# Patient Record
Sex: Male | Born: 1975 | Race: Black or African American | Hispanic: No | Marital: Single | Smoking: Current every day smoker
Health system: Southern US, Community
[De-identification: ages and names within clinical notes are randomized; demographics above are authoritative.]

## PROBLEM LIST (undated history)

## (undated) HISTORY — PX: HIP SURGERY: SHX245

---

## 2015-08-13 ENCOUNTER — Emergency Department (HOSPITAL_COMMUNITY)
Admission: EM | Admit: 2015-08-13 | Discharge: 2015-08-13 | Disposition: A | Discharge status: Home / Self Care | Payer: Self-pay | Attending: Emergency Medicine | Admitting: Emergency Medicine

## 2015-08-13 ENCOUNTER — Encounter (HOSPITAL_COMMUNITY): Payer: Self-pay

## 2015-08-13 DIAGNOSIS — R21 Rash and other nonspecific skin eruption: Secondary | ICD-10-CM | POA: Insufficient documentation

## 2015-08-13 DIAGNOSIS — F17200 Nicotine dependence, unspecified, uncomplicated: Secondary | ICD-10-CM | POA: Insufficient documentation

## 2015-08-13 DIAGNOSIS — J029 Acute pharyngitis, unspecified: Secondary | ICD-10-CM | POA: Insufficient documentation

## 2015-08-13 DIAGNOSIS — K029 Dental caries, unspecified: Secondary | ICD-10-CM | POA: Insufficient documentation

## 2015-08-13 DIAGNOSIS — I10 Essential (primary) hypertension: Secondary | ICD-10-CM | POA: Insufficient documentation

## 2015-08-13 LAB — RAPID STREP SCREEN (MED CTR MEBANE ONLY): Streptococcus, Group A Screen (Direct): NEGATIVE

## 2015-08-13 MED ORDER — PERMETHRIN 5 % EX CREA: TOPICAL_CREAM | CUTANEOUS | Status: AC

## 2015-08-13 MED ORDER — PENICILLIN V POTASSIUM 500 MG PO TABS: 500.0000 mg | ORAL_TABLET | Freq: Four times a day (QID) | ORAL | Status: AC

## 2015-08-13 NOTE — ED Provider Notes (Signed)
CSN: [id number]     Arrival date & time 08/13/15  0719 History   First MD Initiated Contact with Patient 08/13/15 [phone]     Chief Complaint  Patient presents with  . Rash     (Consider location/radiation/quality/duration/timing/severity/associated sxs/prior Treatment) HPI Comments: Here with girlfriend who has rash and concern for bed bugs/scabies.  He does not have a rash himself but is concerned about possible exposure.  He also c/o throat pain x 2 months, treated for ear infection with zithromax which he completed.  C/o pain with swallowing.  No fever, SOB, CP.  Chews tobacco and smokes and concerned because his "gums are receeding".  Does not have a dentist or doctor.    Patient is a 40 y.o. male presenting with rash. The history is provided by the patient.  Rash Associated symptoms: sore throat   Associated symptoms: no abdominal pain, no fever, no headaches, no nausea, no shortness of breath and not vomiting     History reviewed. No pertinent past medical history. Past Surgical History  Procedure Laterality Date  . Hip surgery     No family history on file. Social History  Substance Use Topics  . Smoking status: Current Every Day Smoker  . Smokeless tobacco: None  . Alcohol Use: No    Review of Systems  Constitutional: Negative for fever, activity change and appetite change.  HENT: Positive for dental problem and sore throat. Negative for congestion and drooling.   Eyes: Negative for visual disturbance.  Respiratory: Negative for cough, chest tightness and shortness of breath.   Cardiovascular: Negative for chest pain.  Gastrointestinal: Negative for nausea, vomiting and abdominal pain.  Genitourinary: Negative for dysuria and hematuria.  Skin: Positive for rash.  Neurological: Negative for dizziness, weakness, light-headedness and headaches.  A complete 10 system review of systems was obtained and all systems are negative except as noted in the HPI and PMH.       Allergies  Review of patient's allergies indicates no known allergies.  Home Medications   Prior to Admission medications   Medication Sig Start Date End Date Taking? Authorizing Provider  penicillin v potassium (VEETID) 500 MG tablet Take 1 tablet (500 mg total) by mouth 4 (four) times daily. 08/13/15 08/20/15  Glynn Octave, MD  permethrin (ELIMITE) 5 % cream Apply to affected area once 08/13/15   Glynn Octave, MD   BP 157/105 mmHg  Pulse 89  Temp(Src) 97.9 F (36.6 C) (Oral)  Resp 18  Ht  (1.854 m)  Wt 179 lb (81.194 kg)  BMI 23.62 kg/m2  SpO2 99% Physical Exam  Constitutional: He is oriented to person, place, and time. He appears well-developed and well-nourished. No distress.  HENT:  Head: Normocephalic and atraumatic.  Mouth/Throat: Oropharynx is clear and moist. No oropharyngeal exudate.  Multiple missing teeth, Large carie to R lower molar.  Floor of mouth soft, no abscess. Tonsils symmetric, uvula midline. 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  Eyes: Conjunctivae and EOM are normal. Pupils are equal, round, and reactive to light.  Neck: Normal range of motion. Neck supple.  No meningismus.  Cardiovascular: Normal rate, regular rhythm, normal heart sounds and intact distal pulses.   No murmur heard. Pulmonary/Chest: Effort normal and breath sounds normal. No respiratory distress.  Abdominal: Soft. There is no tenderness. There is no rebound and no guarding.  Musculoskeletal: Normal range of motion. He exhibits no edema or tenderness.  Neurological: He is alert and oriented  to person, place, and time. No cranial nerve deficit. He exhibits normal muscle tone. Coordination normal.  No ataxia on finger to nose bilaterally. No pronator drift. 5/5 strength throughout. CN 2-12 intact.Equal grip strength. Sensation intact.   Skin: Skin is warm. No rash noted.  Psychiatric: He has a normal mood and affect. His behavior is normal.   Nursing note and vitals reviewed.   ED Course  Procedures (including critical care time) Labs Review Labs Reviewed  RAPID STREP SCREEN (NOT [facility])    Imaging Review No results found. I have personally reviewed and evaluated these images and lab results as part of my medical decision-making.   EKG Interpretation None      MDM   Final diagnoses:  Rash and nonspecific skin eruption  Dental caries  Essential hypertension   Here with girlfriend who has rash concerning for bed bugs/scabies.  No rash himself.  No CP or SOB.   Also c/o throat and gum pain.  Hx smoker and tobacco abuse.  Several missing teeth and caries.  No abscess or Ludwig's angina.  Discussed with patient he needs to stop smoking and stop chewing tobacco. He is at risk for developing oral cancer. We'll treat dental caries. Also treat empirically for scabies/bedbugs exposure with permethrin cream.    Glynn OctaveStephen Leanor Voris, MD 08/13/15 [phone]

## 2015-08-13 NOTE — Discharge Instructions (Signed)
Dental Pain Follow up with your doctor regarding your elevated blood pressure. Return to the ED if you develop new or worsening symptoms. Dental pain may be caused by many things, including:  Tooth decay (cavities or caries). Cavities expose the nerve of your tooth to air and hot or cold temperatures. This can cause pain or discomfort.  Abscess or infection. A dental abscess is a collection of infected pus from a bacterial infection in the inner part of the tooth (pulp). It usually occurs at the end of the tooth's root.  Injury.  An unknown reason (idiopathic). Your pain may be mild or severe. It may only occur when:  You are chewing.  You are exposed to hot or cold temperature.  You are eating or drinking sugary foods or beverages, such as soda or candy. Your pain may also be constant. HOME CARE INSTRUCTIONS Watch your dental pain for any changes. The following actions may help to lessen any discomfort that you are feeling:  Take medicines only as directed by your dentist.  If you were prescribed an antibiotic medicine, finish all of it even if you start to feel better.  Keep all follow-up visits as directed by your dentist. This is important.  Do not apply heat to the outside of your face.  Rinse your mouth or gargle with salt water if directed by your dentist. This helps with pain and swelling.  You can make salt water by adding  tsp of salt to 1 cup of warm water.  Apply ice to the painful area of your face:  Put ice in a plastic bag.  Place a towel between your skin and the bag.  Leave the ice on for 20 minutes, 2-3 times per day.  Avoid foods or drinks that cause you pain, such as:  Very hot or very cold foods or drinks.  Sweet or sugary foods or drinks. SEEK MEDICAL CARE IF:  Your pain is not controlled with medicines.  Your symptoms are worse.  You have new symptoms. SEEK IMMEDIATE MEDICAL CARE IF:  You are unable to open your mouth.  You are having  trouble breathing or swallowing.  You have a fever.  Your face, neck, or jaw is swollen.   This information is not intended to replace advice given to you by your health care provider. Make sure you discuss any questions you have with your health care provider.   Document Released: 02/21/2005 Document Revised: 07/08/2014 Document Reviewed: 02/17/2014 Elsevier Interactive Patient Education [employer]2016 [employer]

## 2015-08-13 NOTE — ED Notes (Signed)
Pt reports has had an itchy rash for the past few days.  Pt also reports 2 months ago had an ear and throat infection and says since then he has had pain in throat and shoulder.  Pt also says he chews tobacco and is concerned because his gums are receeding.

## 2015-08-15 LAB — CULTURE, GROUP A STREP (THRC)

## 2020-02-26 ENCOUNTER — Emergency Department (HOSPITAL_COMMUNITY)
Admission: EM | Admit: 2020-02-26 | Discharge: 2020-02-26 | Disposition: A | Discharge status: Home / Self Care | Payer: BLUE CROSS/BLUE SHIELD | Attending: Emergency Medicine | Admitting: Emergency Medicine

## 2020-02-26 ENCOUNTER — Encounter (HOSPITAL_COMMUNITY): Payer: Self-pay | Admitting: Emergency Medicine

## 2020-02-26 ENCOUNTER — Other Ambulatory Visit: Payer: Self-pay

## 2020-02-26 ENCOUNTER — Emergency Department (HOSPITAL_COMMUNITY): Payer: BLUE CROSS/BLUE SHIELD

## 2020-02-26 DIAGNOSIS — K625 Hemorrhage of anus and rectum: Secondary | ICD-10-CM | POA: Diagnosis present

## 2020-02-26 DIAGNOSIS — F1590 Other stimulant use, unspecified, uncomplicated: Secondary | ICD-10-CM | POA: Insufficient documentation

## 2020-02-26 DIAGNOSIS — K529 Noninfective gastroenteritis and colitis, unspecified: Secondary | ICD-10-CM | POA: Diagnosis not present

## 2020-02-26 DIAGNOSIS — F17210 Nicotine dependence, cigarettes, uncomplicated: Secondary | ICD-10-CM | POA: Diagnosis not present

## 2020-02-26 LAB — CBC WITH DIFFERENTIAL/PLATELET
Abs Immature Granulocytes: 0.04 10*3/uL (ref 0.00–0.07)
Basophils Absolute: 0 10*3/uL (ref 0.0–0.1)
Basophils Relative: 1 %
Eosinophils Absolute: 0.1 10*3/uL (ref 0.0–0.5)
Eosinophils Relative: 1 %
HCT: 39.5 % (ref 39.0–52.0)
Hemoglobin: 13 g/dL (ref 13.0–17.0)
Immature Granulocytes: 1 %
Lymphocytes Relative: 24 %
Lymphs Abs: 2 10*3/uL (ref 0.7–4.0)
MCH: 32.8 pg (ref 26.0–34.0)
MCHC: 32.9 g/dL (ref 30.0–36.0)
MCV: 99.7 fL (ref 80.0–100.0)
Monocytes Absolute: 0.8 10*3/uL (ref 0.1–1.0)
Monocytes Relative: 10 %
Neutro Abs: 5.3 10*3/uL (ref 1.7–7.7)
Neutrophils Relative %: 63 %
Platelets: 272 10*3/uL (ref 150–400)
RBC: 3.96 MIL/uL — ABNORMAL LOW (ref 4.22–5.81)
RDW: 13 % (ref 11.5–15.5)
WBC: 8.3 10*3/uL (ref 4.0–10.5)
nRBC: 0 % (ref 0.0–0.2)

## 2020-02-26 LAB — COMPREHENSIVE METABOLIC PANEL
ALT: 16 U/L (ref 0–44)
AST: 17 U/L (ref 15–41)
Albumin: 3.6 g/dL (ref 3.5–5.0)
Alkaline Phosphatase: 52 U/L (ref 38–126)
Anion gap: 7 (ref 5–15)
BUN: 8 mg/dL (ref 6–20)
CO2: 24 mmol/L (ref 22–32)
Calcium: 8.3 mg/dL — ABNORMAL LOW (ref 8.9–10.3)
Chloride: 105 mmol/L (ref 98–111)
Creatinine, Ser: 1 mg/dL (ref 0.61–1.24)
GFR, Estimated: >60 mL/min (ref >60)
Glucose, Bld: 114 mg/dL — ABNORMAL HIGH (ref 70–99)
Potassium: 3.7 mmol/L (ref 3.5–5.1)
Sodium: 136 mmol/L (ref 135–145)
Total Bilirubin: 0.3 mg/dL (ref 0.3–1.2)
Total Protein: 6.6 g/dL (ref 6.5–8.1)

## 2020-02-26 LAB — POC OCCULT BLOOD, ED: Fecal Occult Bld: NEGATIVE

## 2020-02-26 MED ORDER — CIPROFLOXACIN HCL 500 MG PO TABS: ORAL_TABLET | ORAL | 0 refills | Status: AC

## 2020-02-26 MED ORDER — IOHEXOL 300 MG/ML  SOLN
100.0000 mL | Freq: Once | INTRAMUSCULAR | Status: AC | PRN
  Administered 2020-02-26: 100 mL via INTRAVENOUS

## 2020-02-26 MED ORDER — METRONIDAZOLE 500 MG PO TABS: ORAL_TABLET | ORAL | 0 refills | Status: AC

## 2020-02-26 MED ORDER — IOHEXOL 350 MG/ML SOLN: 100.0000 mL | Freq: Once | INTRAVENOUS | Status: DC | PRN

## 2020-02-26 MED ORDER — DICYCLOMINE HCL 20 MG PO TABS: ORAL_TABLET | ORAL | 0 refills | Status: AC

## 2020-02-26 NOTE — ED Triage Notes (Signed)
Patient states he started having left side pain 2 weeks ago as well as bleeding from the rectal area.

## 2020-02-26 NOTE — ED Notes (Signed)
Pt to CT

## 2020-02-26 NOTE — Discharge Instructions (Addendum)
Follow-up with Dr. Karilyn Cota or one of his partners in the next couple weeks.  Make sure you have your blood pressure checked in the next couple weeks also

## 2020-02-26 NOTE — ED Provider Notes (Signed)
[facility] EMERGENCY DEPARTMENT Provider Note   CSN: [id number] Arrival date & time: 02/26/20  1749     History No chief complaint on file.   Matthew Knapp is a 44 y.o. male.  Patient complains of rectal bleeding and mild lower abdominal discomfort  The history is provided by the patient. No language interpreter was used.  Rectal Bleeding Quality:  Bright red Amount:  Scant Timing:  Intermittent Chronicity:  New Context: not anal fissures   Similar prior episodes: no   Relieved by:  Nothing Worsened by:  Nothing Ineffective treatments:  None tried Associated symptoms: abdominal pain        History reviewed. No pertinent past medical history.  There are no problems to display for this patient.   Past Surgical History:  Procedure Laterality Date  . HIP SURGERY         No family history on file.  Social History   Tobacco Use  . Smoking status: Current Every Day Smoker    Packs/day: 0.50    Types: Cigarettes  . Smokeless tobacco: Never Used  Vaping Use  . Vaping Use: Never used  Substance Use Topics  . Alcohol use: No  . Drug use: Yes    Types: Marijuana    Comment: not every week    Home Medications Prior to Admission medications   Medication Sig Start Date End Date Taking? Authorizing Provider  buPROPion HCl (WELLBUTRIN PO) Take 1 tablet by mouth daily.   Yes [provider]  QUEtiapine (SEROQUEL) 50 MG tablet Take 50 mg by mouth daily at 12 noon.   Yes [provider]  ciprofloxacin (CIPRO) 500 MG tablet One po bid 02/26/20   Bethann Berkshire, MD  dicyclomine (BENTYL) 20 MG tablet Take 1 every 8 hours if needed for abdominal cramping 02/26/20   Bethann Berkshire, MD  metroNIDAZOLE (FLAGYL) 500 MG tablet One po tid 02/26/20   Bethann Berkshire, MD  permethrin (ELIMITE) 5 % cream Apply to affected area once Patient not taking: No sig reported 08/13/15   Glynn Octave, MD    Allergies    Patient has no known allergies.  Review of  Systems   Review of Systems  Constitutional: Negative for appetite change and fatigue.  HENT: Negative for congestion, ear discharge and sinus pressure.   Eyes: Negative for discharge.  Respiratory: Negative for cough.   Cardiovascular: Negative for chest pain.  Gastrointestinal: Positive for abdominal pain and hematochezia. Negative for diarrhea.       Rectal bleeding  Genitourinary: Negative for frequency and hematuria.  Musculoskeletal: Negative for back pain.  Skin: Negative for rash.  Neurological: Negative for seizures and headaches.  Psychiatric/Behavioral: Negative for hallucinations.    Physical Exam Updated Vital Signs BP (!) 133/92   Pulse 74   Temp 99.1 F (37.3 C) (Oral)   Resp 20   Ht 6\' 2"  (1.88 m)   Wt 99.3 kg   SpO2 99%   BMI 28.12 kg/m   Physical Exam Vitals reviewed.  Constitutional:      Appearance: He is well-developed.  HENT:     Head: Normocephalic.     Nose: Nose normal.  Eyes:     General: No scleral icterus.    Extraocular Movements: EOM normal.     Conjunctiva/sclera: Conjunctivae normal.  Neck:     Thyroid: No thyromegaly.  Cardiovascular:     Rate and Rhythm: Normal rate and regular rhythm.     Heart sounds: No murmur heard. No friction rub.  No gallop.   Pulmonary:     Breath sounds: No stridor. No wheezing or rales.  Chest:     Chest wall: No tenderness.  Abdominal:     General: There is no distension.     Tenderness: There is abdominal tenderness. There is no rebound.     Comments: Minimal left lower quadrant tenderness  Genitourinary:    Comments: Brown stool heme positive patient also has external hemorrhoids Musculoskeletal:        General: No edema. Normal range of motion.     Cervical back: Neck supple.  Lymphadenopathy:     Cervical: No cervical adenopathy.  Skin:    Findings: No erythema or rash.  Neurological:     Mental Status: He is alert and oriented to person, place, and time.     Motor: No abnormal muscle  tone.     Coordination: Coordination normal.  Psychiatric:        Mood and Affect: Mood and affect normal.        Behavior: Behavior normal.     ED Results / Procedures / Treatments   Labs (all labs ordered are listed, but only abnormal results are displayed) Labs Reviewed  CBC WITH DIFFERENTIAL/PLATELET - Abnormal; Notable for the following components:      Result Value   RBC 3.96 (*)    All other components within normal limits  COMPREHENSIVE METABOLIC PANEL - Abnormal; Notable for the following components:   Glucose, Bld 114 (*)    Calcium 8.3 (*)    All other components within normal limits    EKG None  Radiology CT ABDOMEN PELVIS W CONTRAST  Result Date: 02/26/2020 CLINICAL DATA:  Abdominal infection, abscess, left-sided pain for 2 weeks EXAM: CT ABDOMEN AND PELVIS WITH CONTRAST TECHNIQUE: Multidetector CT imaging of the abdomen and pelvis was performed using the standard protocol following bolus administration of intravenous contrast. CONTRAST:  OMNIPAQUE IOHEXOL 300 MG/ML  SOLN COMPARISON:  None. FINDINGS: Lower chest: No acute abnormality. Hepatobiliary: No focal liver abnormality is seen. No gallstones, gallbladder wall thickening, or biliary dilatation. Pancreas: Unremarkable. No pancreatic ductal dilatation or surrounding inflammatory changes. Spleen: Normal in size without focal abnormality. Adrenals/Urinary Tract: Adrenal glands are unremarkable. Kidneys are normal, without renal calculi, focal lesion, or hydronephrosis. Bladder is unremarkable. Stomach/Bowel: Normal stomach. No bowel dilatation to suggest obstruction. Relative colonic wall thickening of the proximal transverse colon and descending colon which may be secondary to underdistention versus mild colitis. Diverticulosis without evidence of diverticulitis. Vascular/Lymphatic: No significant vascular findings are present. No enlarged abdominal or pelvic lymph nodes. Reproductive: Prostate is unremarkable.  Other: No abdominal wall hernia or abnormality. No abdominopelvic ascites. Musculoskeletal: No acute osseous abnormality. No aggressive osseous lesion. IMPRESSION: Relative colonic wall thickening of the proximal transverse colon and descending colon which may be secondary to underdistention versus mild colitis. Diverticulosis without evidence of diverticulitis. Electronically Signed   By: Elige Ko   On: 02/26/2020 11:05    Procedures Procedures (including critical care time)  Medications Ordered in ED Medications  iohexol (OMNIPAQUE) 350 MG/ML injection 100 mL (has no administration in time range)  iohexol (OMNIPAQUE) 300 MG/ML solution 100 mL (100 mLs Intravenous Contrast Given 02/26/20 1035)    ED Course  I have reviewed the triage vital signs and the nursing notes.  Pertinent labs & imaging results that were available during my care of the patient were reviewed by me and considered in my medical decision making (see chart for details).  MDM Rules/Calculators/A&P                          CT scan shows possible colitis.  Patient having rectal bleeding.  He will be placed on Cipro and Flagyl and given some Bentyl for cramping and referred to GI.  Patient's blood pressure was slightly elevated and he is told to have blood pressure checked again next couple weeks Final Clinical Impression(s) / ED Diagnoses Final diagnoses:  Colitis    Rx / DC Orders ED Discharge Orders         Ordered    ciprofloxacin (CIPRO) 500 MG tablet        02/26/20 1305    metroNIDAZOLE (FLAGYL) 500 MG tablet        02/26/20 1305    dicyclomine (BENTYL) 20 MG tablet        02/26/20 1305           Bethann Berkshire, MD 02/26/20 1311

## 2021-10-25 IMAGING — CT CT ABD-PELV W/ CM
2 of 6 series · 16 of 46 positions shown, 18 images · IV contrast (Omnipaque or Isovue)
Comparison: None.

CLINICAL DATA: Abdominal infection, abscess, left-sided pain for 2
weeks

EXAM:
CT ABDOMEN AND PELVIS WITH CONTRAST
TECHNIQUE: Multidetector CT imaging of the abdomen and pelvis was performed
using the standard protocol following bolus administration of
intravenous contrast.
CONTRAST:  100mL OMNIPAQUE IOHEXOL 300 MG/ML  SOLN

[Series 4: axial st · axial · 0.75mm/px · z∈[+976,+1386]mm · 13 of 94 slices shown, 15 images]
[im 6/94  soft-tissue]
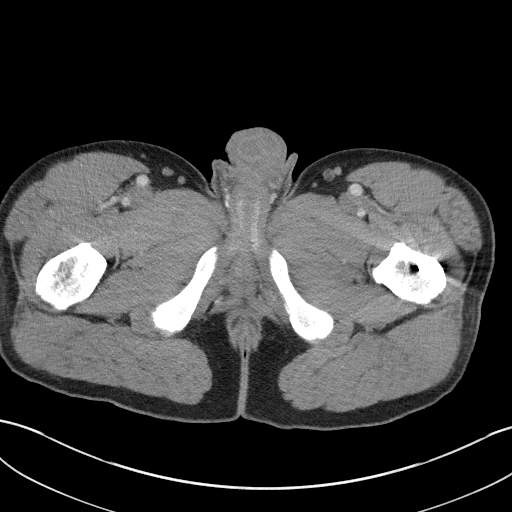
[im 6/94  bone]
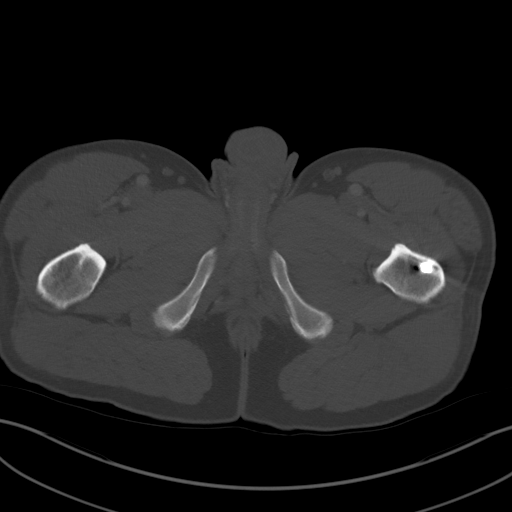
[im 11/94  soft-tissue]
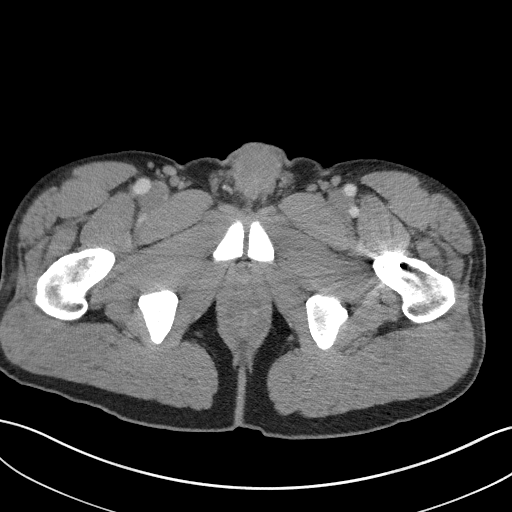
[im 22/94  soft-tissue]
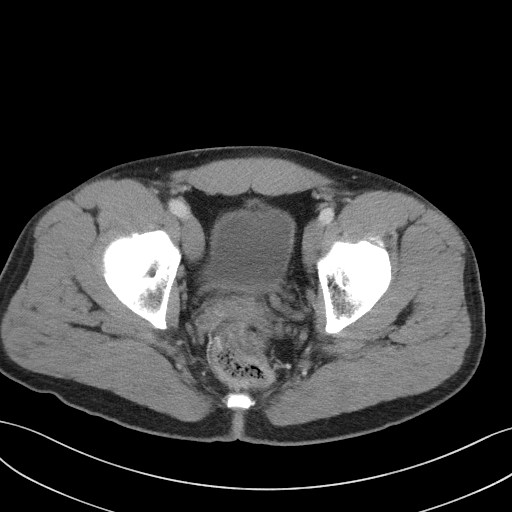
[im 28/94  soft-tissue]
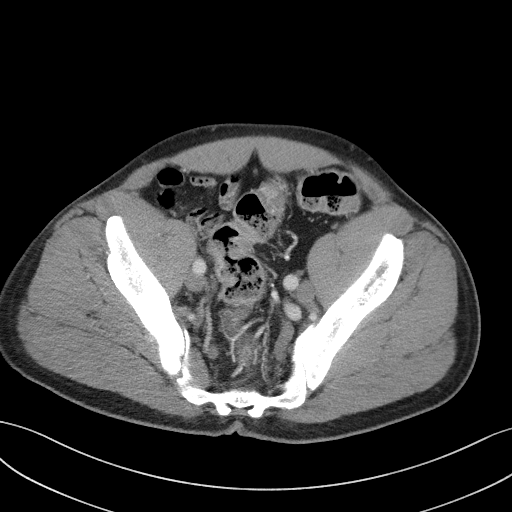
[im 33/94  soft-tissue]
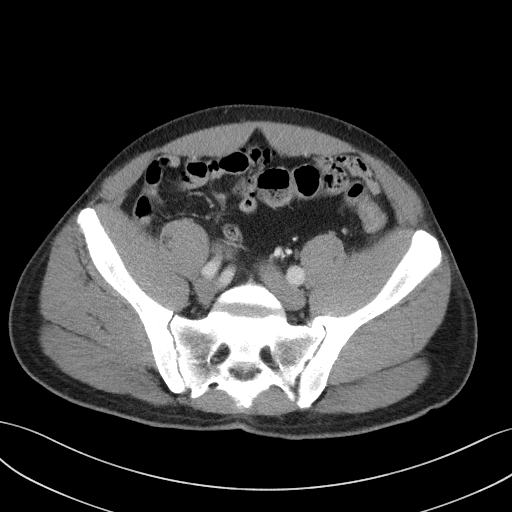
[im 39/94  soft-tissue]
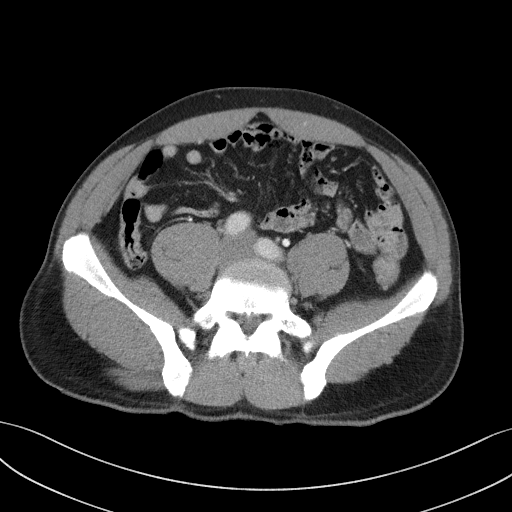
[im 50/94  soft-tissue]
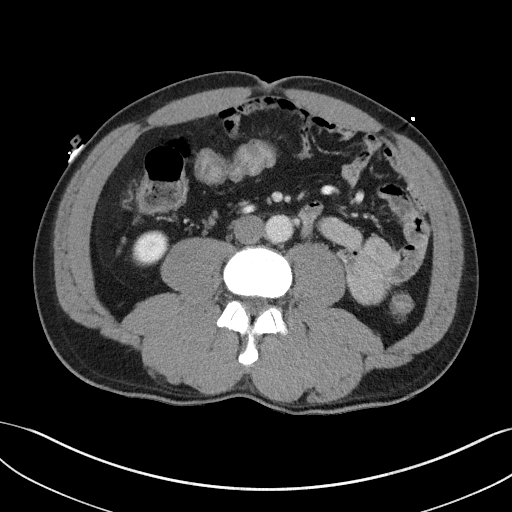
[im 55/94  soft-tissue]
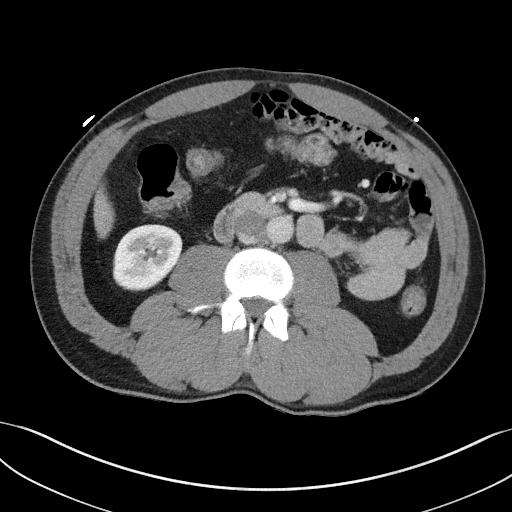
[im 61/94  soft-tissue]
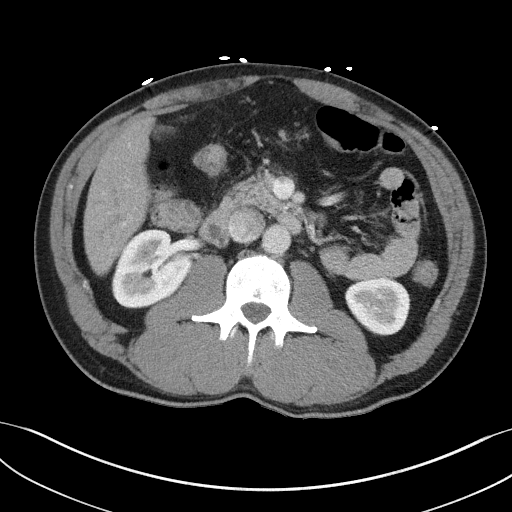
[im 61/94  bone]
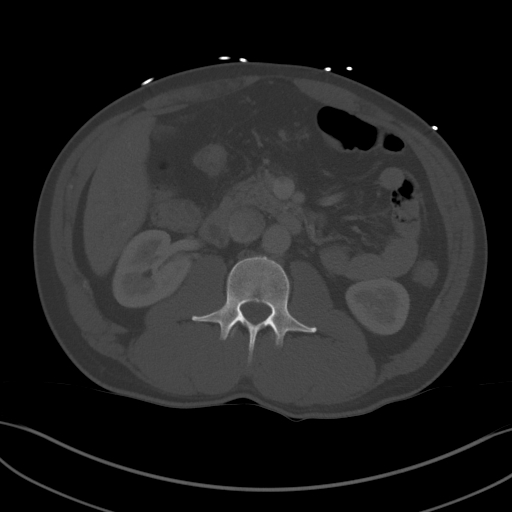
[im 66/94  soft-tissue]
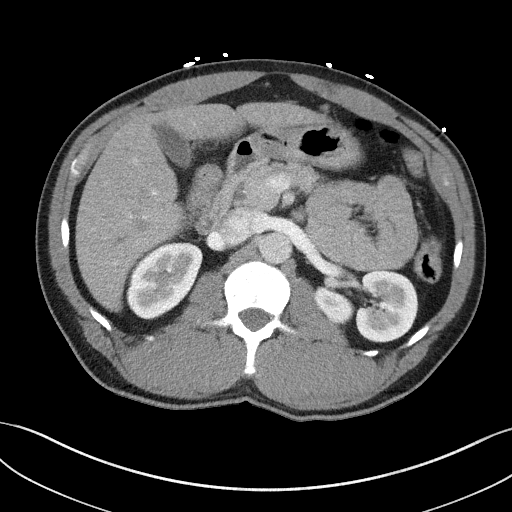
[im 72/94  soft-tissue]
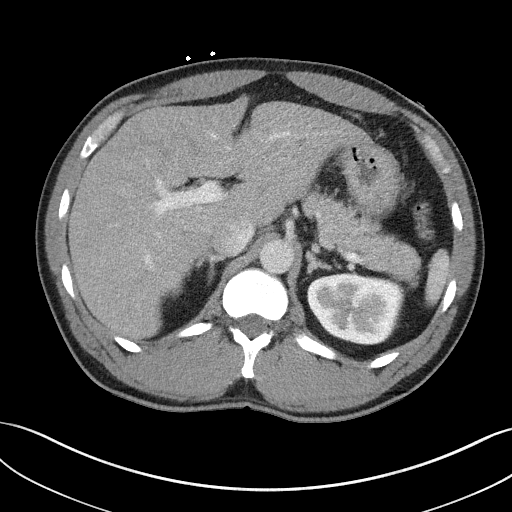
[im 83/94  soft-tissue]
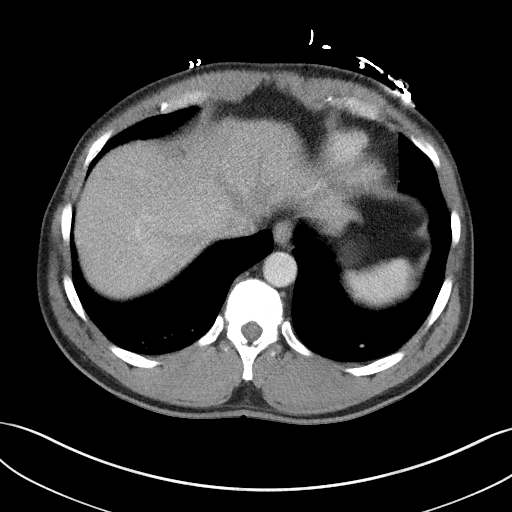
[im 88/94  soft-tissue]
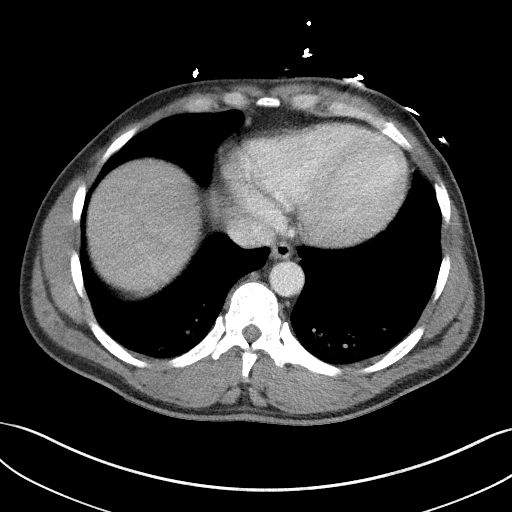

[Series 7: coronal st · coronal · 0.81mm/px · 3 of 116 slices shown]
[im 39/116  soft-tissue]
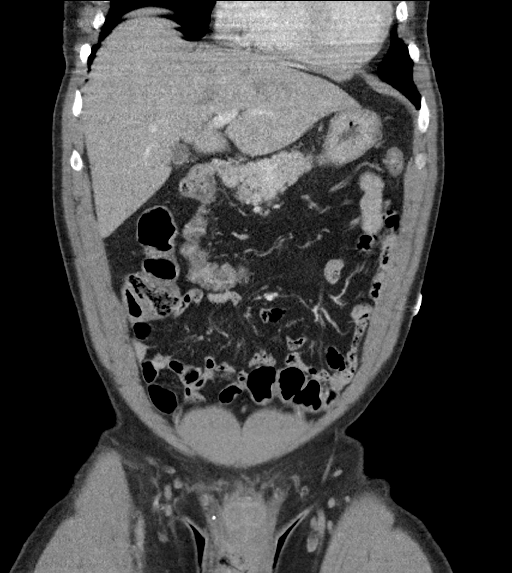
[im 52/116  soft-tissue]
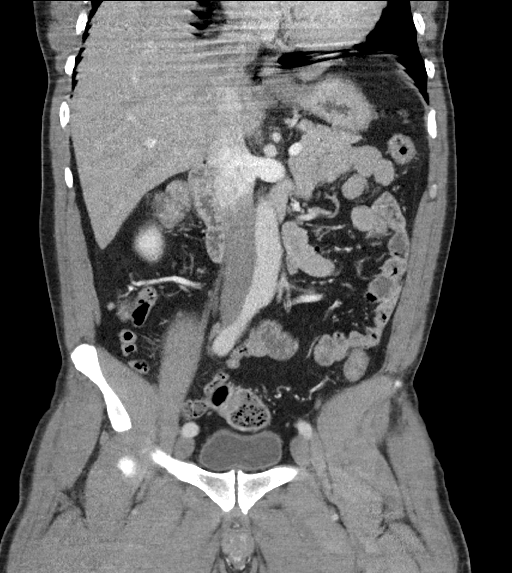
[im 64/116  soft-tissue]
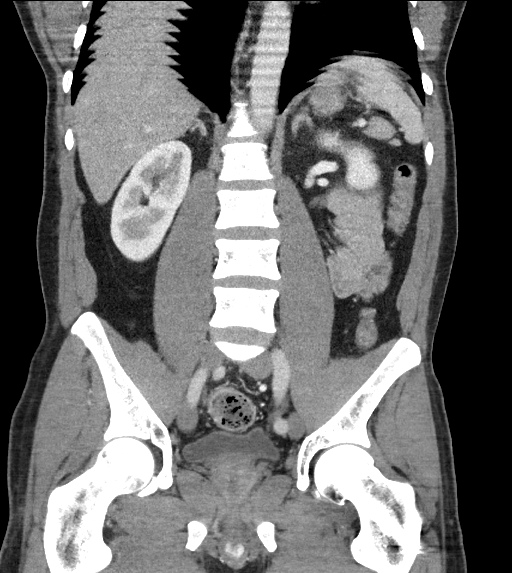

[16 of 46 positions shown; findings below may reference images not displayed]

FINDINGS: Lower chest: No acute abnormality.

Hepatobiliary: No focal liver abnormality is seen. No gallstones,
gallbladder wall thickening, or biliary dilatation.

Pancreas: Unremarkable. No pancreatic ductal dilatation or
surrounding inflammatory changes.

Spleen: Normal in size without focal abnormality.

Adrenals/Urinary Tract: Adrenal glands are unremarkable. Kidneys are
normal, without renal calculi, focal lesion, or hydronephrosis.
Bladder is unremarkable.

Stomach/Bowel: Normal stomach. No bowel dilatation to suggest
obstruction. Relative colonic wall thickening of the proximal
transverse colon and descending colon which may be secondary to
underdistention versus mild colitis. Diverticulosis without evidence
of diverticulitis.

Vascular/Lymphatic: No significant vascular findings are present. No
enlarged abdominal or pelvic lymph nodes.

Reproductive: Prostate is unremarkable.

Other: No abdominal wall hernia or abnormality. No abdominopelvic
ascites.

Musculoskeletal: No acute osseous abnormality. No aggressive osseous
lesion.
IMPRESSION: Relative colonic wall thickening of the proximal transverse colon
and descending colon which may be secondary to underdistention
versus mild colitis. Diverticulosis without evidence of
diverticulitis.

## 2022-05-12 ENCOUNTER — Inpatient Hospital Stay: Admit: 2022-05-12 | Discharge: 2022-05-13

## 2022-05-12 ENCOUNTER — Emergency Department: Admit: 2022-05-12

## 2022-05-12 DIAGNOSIS — R0789 Other chest pain: Secondary | ICD-10-CM

## 2022-05-12 DIAGNOSIS — R079 Chest pain, unspecified: Secondary | ICD-10-CM

## 2022-05-12 LAB — COMPREHENSIVE METABOLIC PANEL
ALT: 29 U/L (ref 12–78)
AST: 15 U/L (ref 15–37)
Albumin/Globulin Ratio: 0.9 — ABNORMAL LOW (ref 1.1–2.2)
Albumin: 3.2 g/dL — ABNORMAL LOW (ref 3.5–5.0)
Alk Phosphatase: 71 U/L (ref 45–117)
Anion Gap: 6 mmol/L (ref 5–15)
BUN/Creatinine Ratio: 11 — ABNORMAL LOW (ref 12–20)
BUN: 14 MG/DL (ref 6–20)
CO2: 29 mmol/L (ref 21–32)
Calcium: 9 MG/DL (ref 8.5–10.1)
Chloride: 109 mmol/L — ABNORMAL HIGH (ref 97–108)
Creatinine: 1.28 MG/DL (ref 0.70–1.30)
Est, Glom Filt Rate: >60 mL/min/{1.73_m2} (ref >60)
Globulin: 3.5 g/dL (ref 2.0–4.0)
Glucose: 108 mg/dL — ABNORMAL HIGH (ref 65–100)
Potassium: 3.6 mmol/L (ref 3.5–5.1)
Sodium: 144 mmol/L (ref 136–145)
Total Bilirubin: 0.2 MG/DL (ref 0.2–1.0)
Total Protein: 6.7 g/dL (ref 6.4–8.2)

## 2022-05-12 LAB — CBC WITH AUTO DIFFERENTIAL
Basophils %: 1 % (ref 0–1)
Basophils Absolute: 0.1 10*3/uL (ref 0.0–0.1)
Eosinophils %: 1 % (ref 0–7)
Eosinophils Absolute: 0.1 10*3/uL (ref 0.0–0.4)
Hematocrit: 42.9 % (ref 36.6–50.3)
Hemoglobin: 14.3 g/dL (ref 12.1–17.0)
Immature Granulocytes %: 1 % — ABNORMAL HIGH (ref 0.0–0.5)
Immature Granulocytes Absolute: 0.1 10*3/uL — ABNORMAL HIGH (ref 0.00–0.04)
Lymphocytes %: 29 % (ref 12–49)
Lymphocytes Absolute: 3.1 10*3/uL (ref 0.8–3.5)
MCH: 31.7 PG (ref 26.0–34.0)
MCHC: 33.3 g/dL (ref 30.0–36.5)
MCV: 95.1 FL (ref 80.0–99.0)
MPV: 8.9 FL (ref 8.9–12.9)
Monocytes %: 8 % (ref 5–13)
Monocytes Absolute: 0.8 10*3/uL (ref 0.0–1.0)
Neutrophils %: 60 % (ref 32–75)
Neutrophils Absolute: 6.4 10*3/uL (ref 1.8–8.0)
Nucleated RBCs: 0 PER 100 WBC
Platelets: 352 10*3/uL (ref 150–400)
RBC: 4.51 M/uL (ref 4.10–5.70)
RDW: 12.1 % (ref 11.5–14.5)
WBC: 10.5 10*3/uL (ref 4.1–11.1)
nRBC: 0 10*3/uL (ref 0.00–0.01)

## 2022-05-12 LAB — MAGNESIUM: Magnesium: 2.2 mg/dL (ref 1.6–2.4)

## 2022-05-12 LAB — EKG 12-LEAD
Atrial Rate: 84 {beats}/min
Diagnosis: NORMAL
P Axis: 63 degrees
P-R Interval: 124 ms
Q-T Interval: 362 ms
QRS Duration: 90 ms
QTc Calculation (Bazett): 427 ms
R Axis: 40 degrees
T Axis: -1 degrees
Ventricular Rate: 84 {beats}/min

## 2022-05-12 LAB — TROPONIN: Troponin, High Sensitivity: 6 ng/L (ref 0–76)

## 2022-05-12 LAB — D-DIMER, QUANTITATIVE: D-Dimer, Quant: 0.63 mg/L FEU (ref 0.00–0.65)

## 2022-05-12 MED ORDER — KETOROLAC TROMETHAMINE 30 MG/ML IJ SOLN
30 MG/ML | INTRAMUSCULAR | Status: AC
Start: 2022-05-12 — End: 2022-05-13

## 2022-05-12 NOTE — ED Notes (Signed)
Attempted to call pt back to room from waiting area x2 no response pt not found in waiting area.

## 2022-05-12 NOTE — ED Provider Notes (Signed)
 [facility] EMERGENCY DEP  EMERGENCY DEPARTMENT ENCOUNTER      Pt Name: Carl Potter  MRN: [id number]  Birthdate 08/22/1975  Date of evaluation: 05/12/2022  Provider: Sueanne JENEANE Camp, APRN - NP    CHIEF COMPLAINT       Chief Complaint   Patient presents with    Shortness of Breath    Chest Pain         HISTORY OF PRESENT ILLNESS   (Location/Symptom, Timing/Onset, Context/Setting, Quality, Duration, Modifying Factors, Severity)  Note limiting factors.   Carl Potter is a 47 y.o. male presenting to the ED c/o right sided chest pains that are sharp and increased SOB w/ deep inspiration for the past 2 days. Pt reports she was bowling recently. + rhinorrhea. + cough. Pt reports pain increases w/ movement.     Denies recent falls/ injury, taking med for pain, hx cardiac disease, family hx heart disease, abnormal leg swelling, hx blood clots, hx CA/ chemo, use of hormone therapy.     Medical hx: HTN  Surgical hx: left hip surgery  Social hx: admits to cigarette use, admits to marijuana use, denies illicit drug use, denies etoh      The history is provided by the patient. No language interpreter was used.         Review of External Medical Records:     Nursing Notes were reviewed.    REVIEW OF SYSTEMS    (2-9 systems for level 4, 10 or more for level 5)     Review of Systems   Constitutional:  Negative for activity change, appetite change, chills and fever.   HENT:  Positive for rhinorrhea. Negative for sore throat.    Respiratory:  Positive for cough. Negative for shortness of breath.    Cardiovascular:  Positive for chest pain. Negative for leg swelling.   Gastrointestinal:  Negative for abdominal pain, nausea and vomiting.   Musculoskeletal:  Negative for myalgias.   Skin:  Negative for rash.   All other systems reviewed and are negative.      Except as noted above the remainder of the review of systems was reviewed and negative.       PAST MEDICAL HISTORY     Past Medical History:   Diagnosis Date    Hypertension           SURGICAL HISTORY     History reviewed. No pertinent surgical history.      CURRENT MEDICATIONS       Previous Medications    No medications on file       ALLERGIES     Patient has no known allergies.    FAMILY HISTORY     History reviewed. No pertinent family history.       SOCIAL HISTORY              PHYSICAL EXAM    (up to 7 for level 4, 8 or more for level 5)     ED Triage Vitals [05/12/22 1623]   BP Temp Temp src Pulse Resp SpO2 Height Weight - Scale   -- -- -- -- -- -- 1.854 m (6' 1) 113.8 kg (250 lb 14.1 oz)       Body mass index is 33.1 kg/m.    Physical Exam  Vitals reviewed.   Constitutional:       General: He is not in acute distress.     Appearance: Normal appearance. He is not ill-appearing.   HENT:  Head: Normocephalic and atraumatic.   Cardiovascular:      Rate and Rhythm: Normal rate and regular rhythm.   Pulmonary:      Effort: Pulmonary effort is normal. No respiratory distress.      Breath sounds: Normal breath sounds. No wheezing, rhonchi or rales.   Abdominal:      General: Bowel sounds are normal. There is no distension.      Palpations: There is no mass.      Tenderness: There is no abdominal tenderness. There is no guarding.      Hernia: No hernia is present.   Musculoskeletal:         General: Normal range of motion.   Skin:     General: Skin is warm.      Capillary Refill: Capillary refill takes less than 2 seconds.   Neurological:      General: No focal deficit present.      Mental Status: He is alert and oriented to person, place, and time. Mental status is at baseline.      Sensory: No sensory deficit.      Gait: Gait normal.   Psychiatric:         Mood and Affect: Mood normal.         DIAGNOSTIC RESULTS     EKG: All EKG's are interpreted by the Emergency Department Physician who either signs or Co-signs this chart in the absence of a cardiologist.    RADIOLOGY:   Non-plain film images such as CT, Ultrasound and MRI are read by the radiologist. Plain radiographic images are  visualized and preliminarily interpreted by the emergency physician with the below findings:    Interpretation per the Radiologist below, if available at the time of this note:    XR CHEST (2 VW)   Final Result   Very mild left basilar atelectasis.                   LABS:  Labs Reviewed   CBC WITH AUTO DIFFERENTIAL - Abnormal; Notable for the following components:       Result Value    Immature Granulocytes 1 (*)     Absolute Immature Granulocyte 0.1 (*)     All other components within normal limits   COMPREHENSIVE METABOLIC PANEL - Abnormal; Notable for the following components:    Chloride 109 (*)     Glucose 108 (*)     Bun/Cre Ratio 11 (*)     Albumin 3.2 (*)     Albumin/Globulin Ratio 0.9 (*)     All other components within normal limits   MAGNESIUM   TROPONIN   D-DIMER, QUANTITATIVE       All other labs were within normal range or not returned as of this dictation.    EMERGENCY DEPARTMENT COURSE and DIFFERENTIAL DIAGNOSIS/MDM:   Vitals:    Vitals:    05/12/22 1623 05/12/22 1625   BP:  (!) 158/92   Pulse:  88   Resp:  18   Temp:  97.1 F (36.2 C)   TempSrc:  Temporal   SpO2:  97%   Weight: 113.8 kg (250 lb 14.1 oz)    Height: 1.854 m (6' 1)      18: 43  Attempted to call pt in waiting room but unable to find pt.     19:15  Attempted to call pt again but was unable to find pt in waiting room.     Registration attempted to call pt  as well X 2 w/o response.     Presentation, management, and disposition were discussed with the attending physician, Dr. Jocelyne who is in agreement with plan of care.    20:15   Attempted to call pt one more time w/o response. Pt left prior to completion of treatment.     Medical Decision Making  Amount and/or Complexity of Data Reviewed  Labs: ordered.  Radiology: ordered.  ECG/medicine tests: ordered.    Risk  Prescription drug management.    Patient is a 47 year old male presenting to the emergency room complaining of right-sided chest pain and shortness of breath with deep  inspirations for the past 2 days.  Doubt acute coronary syndrome or cardiac arrhythmia given EKG showed normal sinus rhythm and troponin was negative.  Doubt pneumothorax or pneumonia given chest x-ray was negative but did note very mild left basilar atelectasis.  Patient's D-dimer was noted to be 0.63.  When attempted to call patient to review results and recommendation for CTA of the chest to rule out PE, patient was unable to be found in waiting room.  Patient left prior to completion of treatment.  Attempted to call patient multiple times in the waiting room but was unsuccessful.  Was also unable to call patient given no phone number or address was noted in the chart.    ED Course as of 05/12/22 2019   Thu May 12, 2022   1652 EKG interpreted 64 shows normal sinus rhythm at a rate of 84 beats minute.  No acute ST elevation noted.  Normal intervals, no ectopy. [JR]      ED Course User Index  [JR] Riaz, Junaid, DO       CONSULTS:  None    PROCEDURES:  Unless otherwise noted below, none     Procedures      FINAL IMPRESSION      1. Chest pain, unspecified type          DISPOSITION/PLAN   DISPOSITION Eloped - Left Before Treatment Complete 05/12/2022 08:15:35 PM      PATIENT REFERRED TO:  No follow-up provider specified.    DISCHARGE MEDICATIONS:  New Prescriptions    No medications on file         (Please note that portions of this note were completed with a voice recognition program.  Efforts were made to edit the dictations but occasionally words are mis-transcribed.)    Julie Paolini Z Faylynn Stamos, APRN - NP (electronically signed)  Emergency Attending Physician / Physician Assistant / Nurse Practitioner             Nivia Sueanne PEDLAR, APRN - NP  05/12/22 2018       Nivia Sueanne PEDLAR, APRN - NP  05/12/22 2019

## 2022-05-12 NOTE — ED Triage Notes (Signed)
Reports SOB for 2 days. R sided CP with inspiration and belching.
